# Patient Record
Sex: Male | Born: 1990 | Race: White | Marital: Single
Health system: Southern US, Community
[De-identification: ages and names within clinical notes are randomized; demographics above are authoritative.]

## PROBLEM LIST (undated history)

## (undated) DIAGNOSIS — F419 Anxiety disorder, unspecified: Secondary | ICD-10-CM

## (undated) DIAGNOSIS — F909 Attention-deficit hyperactivity disorder, unspecified type: Secondary | ICD-10-CM

---

## 2018-09-01 ENCOUNTER — Encounter: Payer: Self-pay | Admitting: Emergency Medicine

## 2018-09-01 ENCOUNTER — Emergency Department
Admission: EM | Admit: 2018-09-01 | Discharge: 2018-09-01 | Discharge status: Against Medical Advice | Payer: Self-pay | Attending: Emergency Medicine | Admitting: Emergency Medicine

## 2018-09-01 ENCOUNTER — Other Ambulatory Visit: Payer: Self-pay

## 2018-09-01 ENCOUNTER — Emergency Department: Payer: Self-pay

## 2018-09-01 DIAGNOSIS — Z5321 Procedure and treatment not carried out due to patient leaving prior to being seen by health care provider: Secondary | ICD-10-CM | POA: Insufficient documentation

## 2018-09-01 DIAGNOSIS — F419 Anxiety disorder, unspecified: Secondary | ICD-10-CM | POA: Insufficient documentation

## 2018-09-01 HISTORY — DX: Anxiety disorder, unspecified: F41.9

## 2018-09-01 HISTORY — DX: Attention-deficit hyperactivity disorder, unspecified type: F90.9

## 2018-09-01 LAB — BASIC METABOLIC PANEL
Anion gap: 14 (ref 5–15)
BUN: 7 mg/dL (ref 6–20)
CO2: 23 mmol/L (ref 22–32)
Calcium: 9.8 mg/dL (ref 8.9–10.3)
Chloride: 101 mmol/L (ref 98–111)
Creatinine, Ser: 0.91 mg/dL (ref 0.61–1.24)
GFR calc Af Amer: >60 mL/min (ref >60)
GFR calc non Af Amer: >60 mL/min (ref >60)
Glucose, Bld: 160 mg/dL — ABNORMAL HIGH (ref 70–99)
Potassium: 3.4 mmol/L — ABNORMAL LOW (ref 3.5–5.1)
Sodium: 138 mmol/L (ref 135–145)

## 2018-09-01 LAB — CBC
HCT: 45.1 % (ref 39.0–52.0)
Hemoglobin: 15.9 g/dL (ref 13.0–17.0)
MCH: 32.1 pg (ref 26.0–34.0)
MCHC: 35.3 g/dL (ref 30.0–36.0)
MCV: 90.9 fL (ref 80.0–100.0)
Platelets: 287 10*3/uL (ref 150–400)
RBC: 4.96 MIL/uL (ref 4.22–5.81)
RDW: 11.9 % (ref 11.5–15.5)
WBC: 4.4 10*3/uL (ref 4.0–10.5)
nRBC: 0 % (ref 0.0–0.2)

## 2018-09-01 LAB — TROPONIN I (HIGH SENSITIVITY): Troponin I (High Sensitivity): 4 ng/L (ref <18)

## 2018-09-01 MED ORDER — SODIUM CHLORIDE 0.9% FLUSH: 3.0000 mL | Freq: Once | INTRAVENOUS | Status: DC

## 2018-09-01 NOTE — ED Notes (Addendum)
FIRST NURSE NOTE: Pt states "I think I had a heart attack about an hour ago and I'm worried that my heart is going to stop"  Pt is in no acute distress at this time, offered wheelchair, but declined.

## 2018-09-01 NOTE — ED Triage Notes (Signed)
Pt to ED via POV c/o chest pain and stating that he thought he had a heart attack about 1 hour PTA. Pt states that he was not able to control himself and was shaking badly. Pt reports that he started feeling lightheaded and this caused concern. Pt states that he took Vyvance 60 mg this morning, pt also states that he has had increased caffeine use as well as increased stress. Pt just attended his grandfathers funeral yesterday. Pt is extremely anxious on triage.

## 2020-09-18 IMAGING — CR CHEST - 2 VIEW
1 series · 2 of 2 positions shown · non-contrast
Comparison: None.

CLINICAL DATA: Patient afraid his heart was going to stop.

EXAM:
CHEST - 2 VIEW

[Series 1: dg chest 2 view · 0.14mm/px · 2 of 2 slices shown]
[im 1/2]
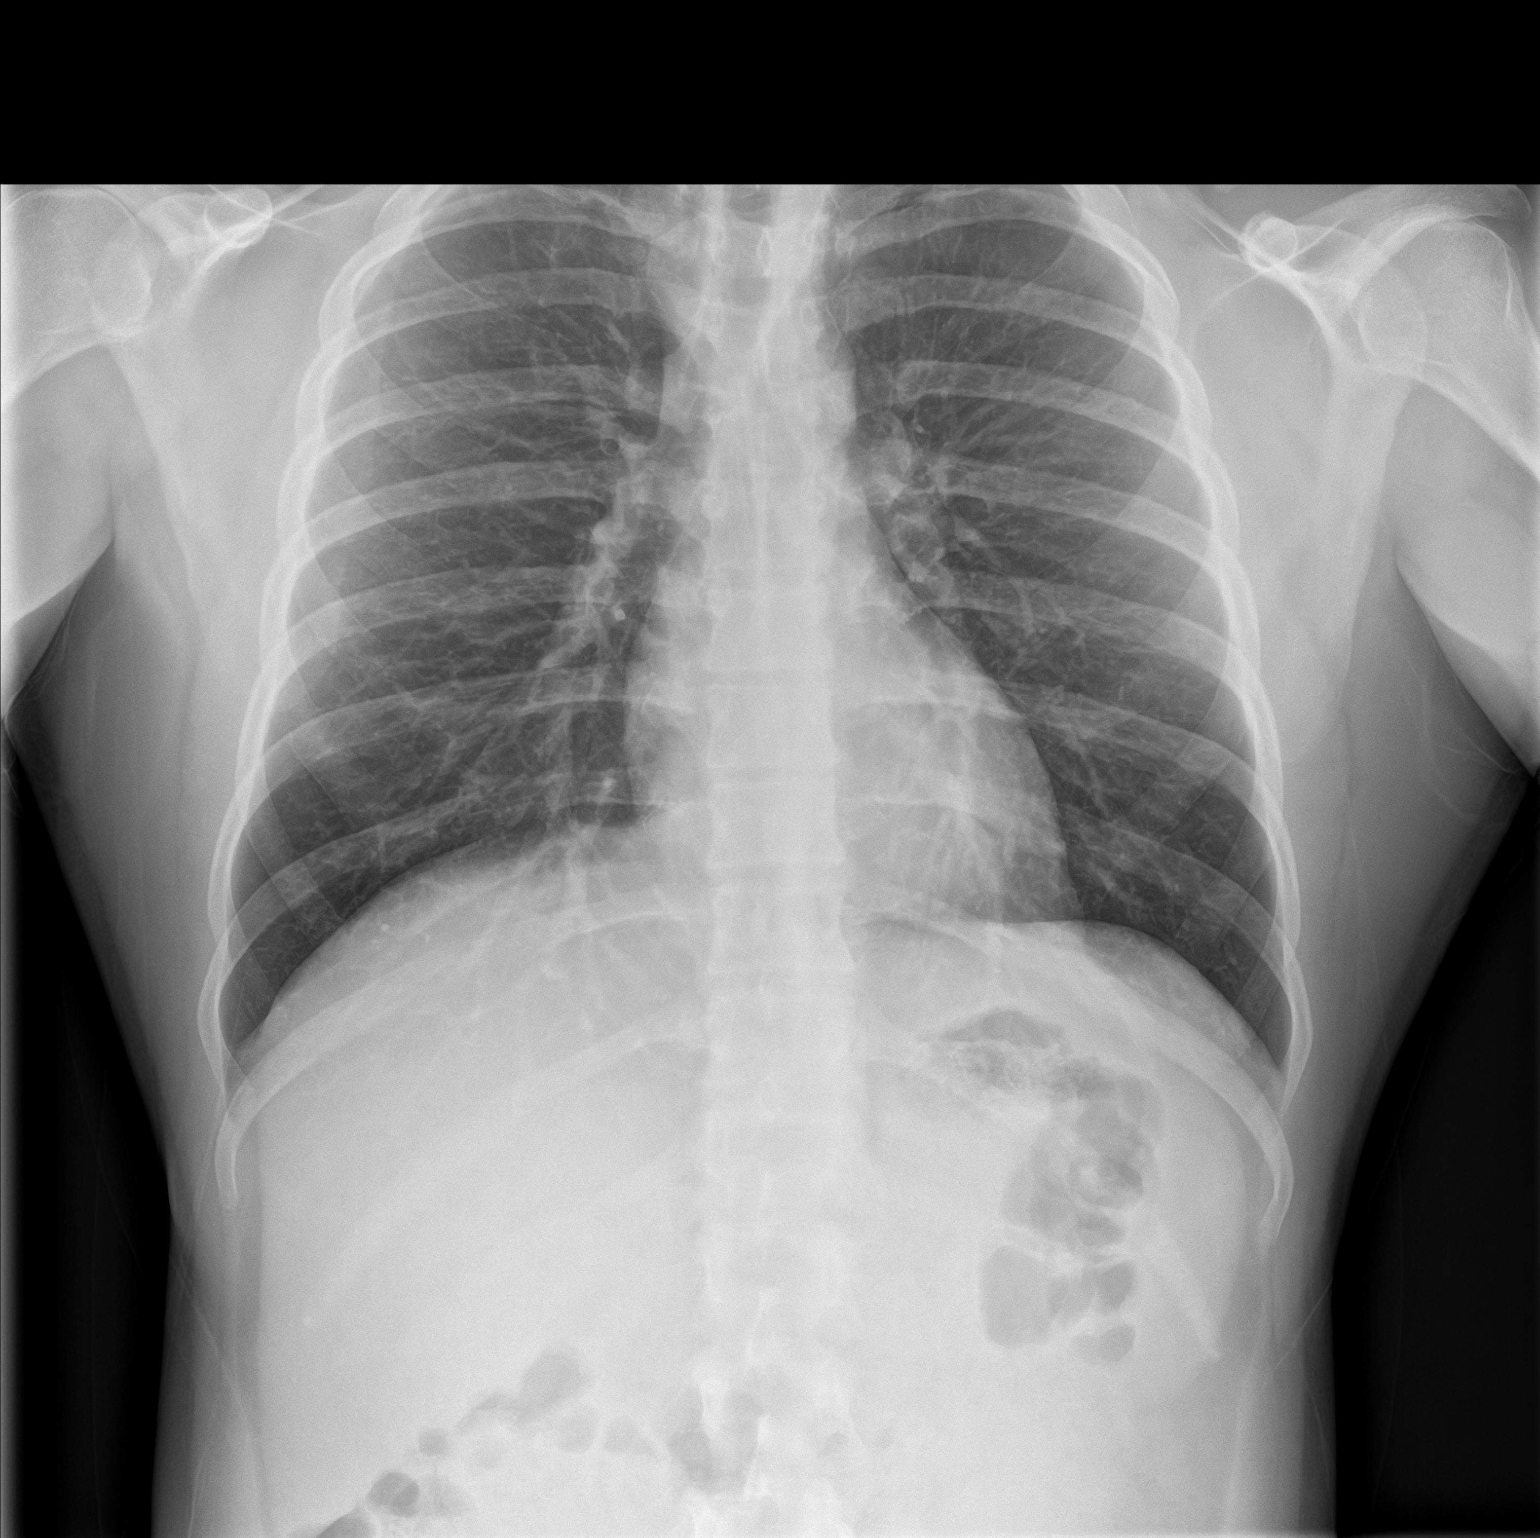
[im 2/2]
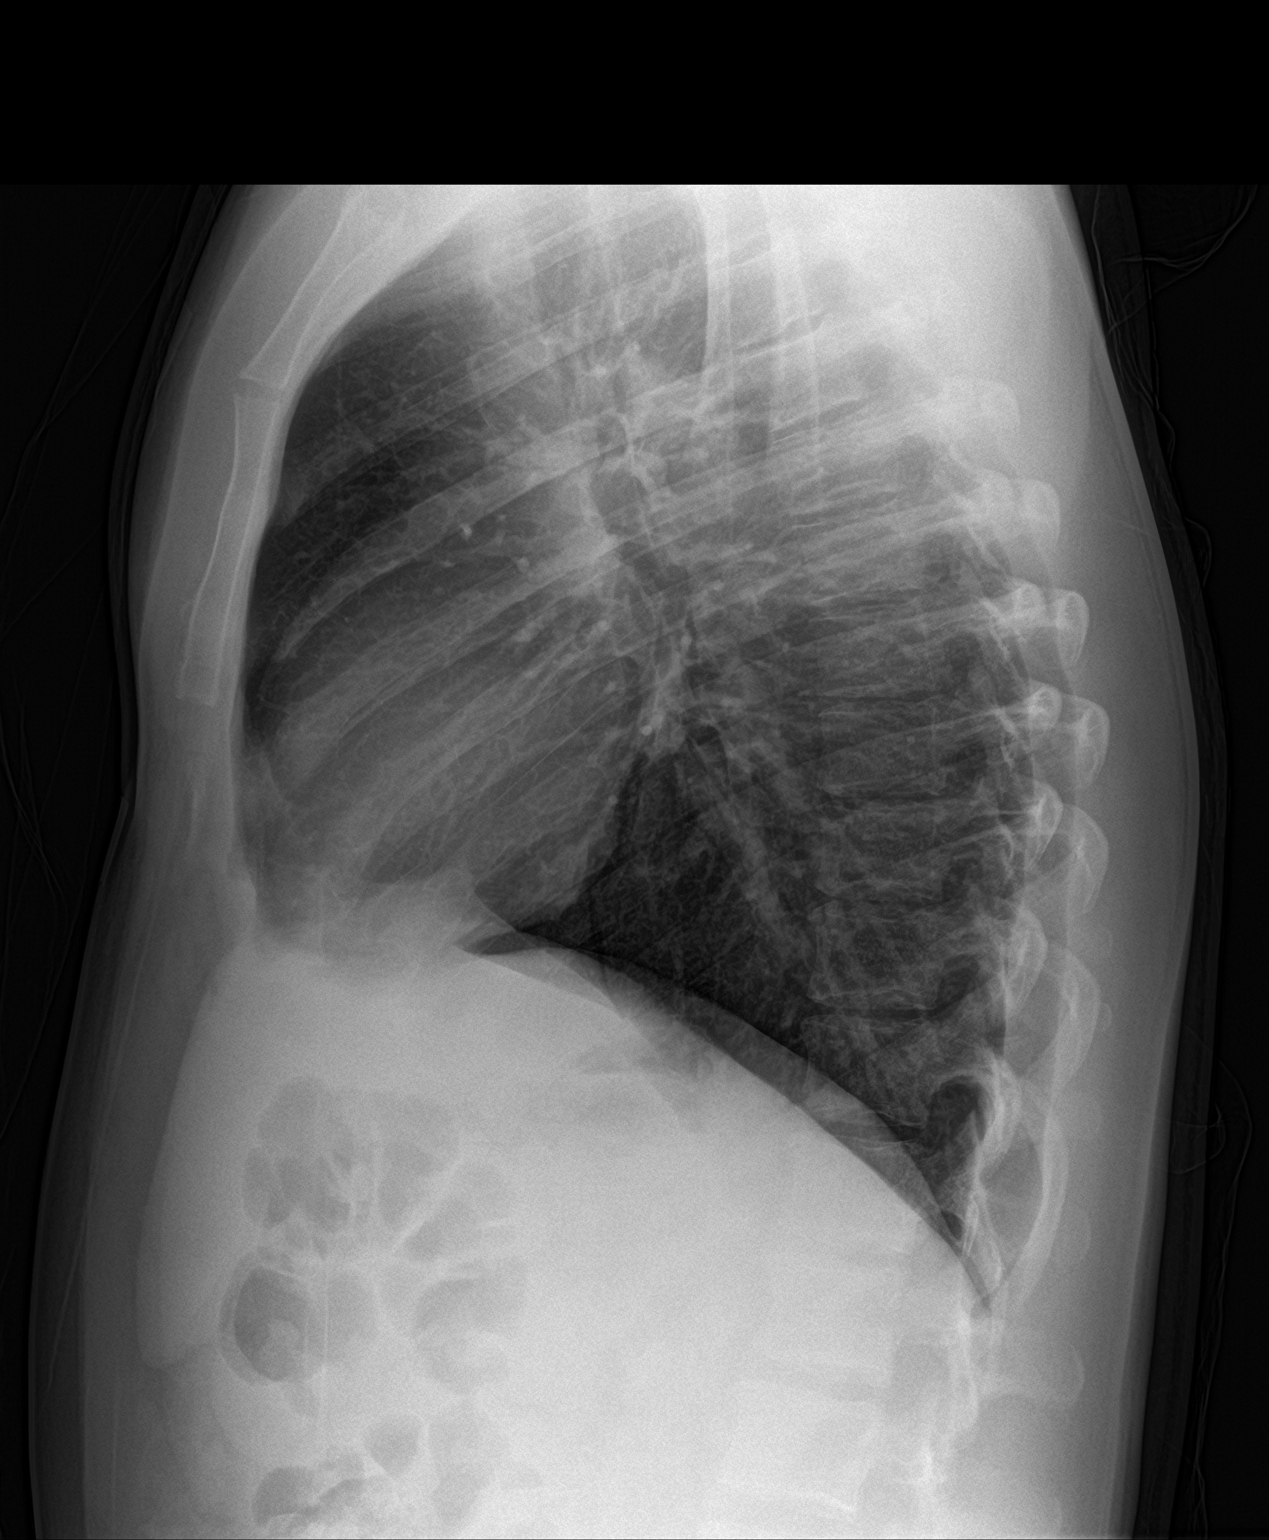

[2 of 2 positions shown; findings below may reference images not displayed]

FINDINGS: The heart size and mediastinal contours are within normal limits.
Both lungs are clear. The visualized skeletal structures are
unremarkable.
IMPRESSION: No active cardiopulmonary disease.
